# Patient Record
Sex: Female | Born: 1980 | Smoking: Never smoker
Health system: Southern US, Community
[De-identification: ages and names within clinical notes are randomized; demographics above are authoritative.]

## PROBLEM LIST (undated history)

## (undated) DIAGNOSIS — N6459 Other signs and symptoms in breast: Secondary | ICD-10-CM

## (undated) HISTORY — PX: UTERINE FIBROID SURGERY: SHX826

---

## 2006-05-29 ENCOUNTER — Inpatient Hospital Stay: Payer: Self-pay | Admitting: Obstetrics & Gynecology

## 2011-05-09 ENCOUNTER — Ambulatory Visit: Payer: Self-pay | Admitting: Advanced Practice Midwife

## 2011-05-19 ENCOUNTER — Encounter: Payer: Self-pay | Admitting: Obstetrics & Gynecology

## 2011-06-16 ENCOUNTER — Encounter: Payer: Self-pay | Admitting: Maternal & Fetal Medicine

## 2011-08-15 ENCOUNTER — Encounter: Payer: Self-pay | Admitting: Maternal and Fetal Medicine

## 2011-10-20 ENCOUNTER — Encounter: Payer: Self-pay | Admitting: Obstetrics and Gynecology

## 2011-11-23 ENCOUNTER — Inpatient Hospital Stay: Payer: Self-pay

## 2011-11-23 LAB — PIH PROFILE
Anion Gap: 15 (ref 7–16)
BUN: 7 mg/dL (ref 7–18)
Calcium, Total: 8.6 mg/dL (ref 8.5–10.1)
Chloride: 108 mmol/L — ABNORMAL HIGH (ref 98–107)
Co2: 21 mmol/L (ref 21–32)
Creatinine: 0.74 mg/dL (ref 0.60–1.30)
EGFR (African American): >60
EGFR (Non-African Amer.): >60
Glucose: 90 mg/dL (ref 65–99)
HCT: 38 % (ref 35.0–47.0)
HGB: 12.6 g/dL (ref 12.0–16.0)
MCH: 31.1 pg (ref 26.0–34.0)
MCHC: 33.2 g/dL (ref 32.0–36.0)
MCV: 94 fL (ref 80–100)
Osmolality: 284 (ref 275–301)
Platelet: 155 10*3/uL (ref 150–440)
Potassium: 4.3 mmol/L (ref 3.5–5.1)
RBC: 4.06 10*6/uL (ref 3.80–5.20)
RDW: 19.1 % — ABNORMAL HIGH (ref 11.5–14.5)
SGOT(AST): 21 U/L (ref 15–37)
Sodium: 144 mmol/L (ref 136–145)
Uric Acid: 4.3 mg/dL (ref 2.6–6.0)
WBC: 7.1 10*3/uL (ref 3.6–11.0)

## 2011-11-23 LAB — PROTEIN / CREATININE RATIO, URINE
Creatinine, Urine: 72.9 mg/dL (ref 30.0–125.0)
Protein, Random Urine: 31 mg/dL — ABNORMAL HIGH (ref 0–12)
Protein/Creat. Ratio: 425 mg/gCREAT — ABNORMAL HIGH (ref 0–200)

## 2011-11-24 LAB — HEMATOCRIT: HCT: 30.1 % — ABNORMAL LOW (ref 35.0–47.0)

## 2013-03-10 IMAGING — US US OB FOLLOW-UP - NRPT [facility]
1 series · 14 of 28 positions shown · non-contrast
Comparison: none

[Series 1: us ob follow-up - nrpt [facility] · 14 of 36 slices shown]
[im 2/36]
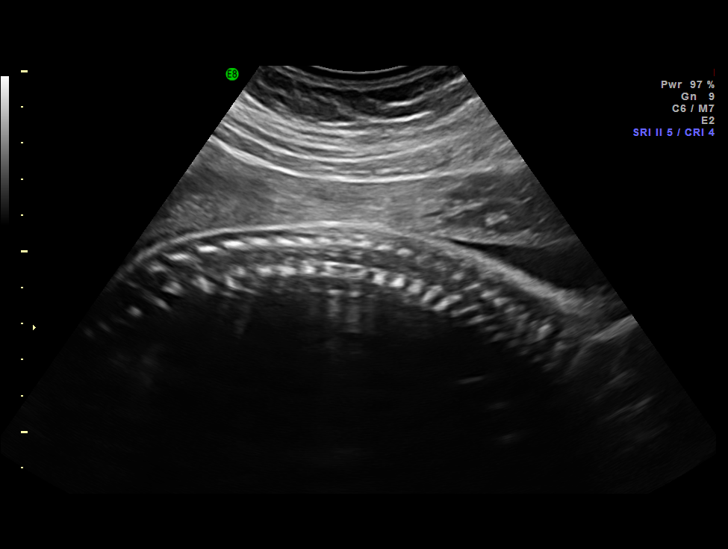
[im 4/36]
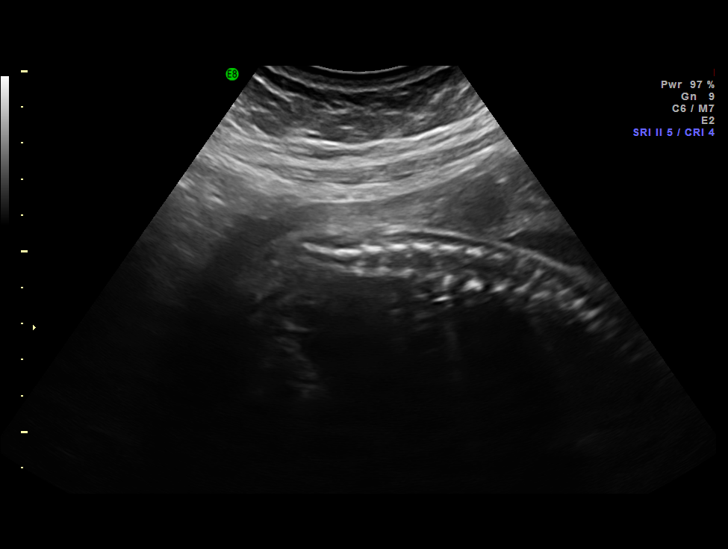
[im 7/36]
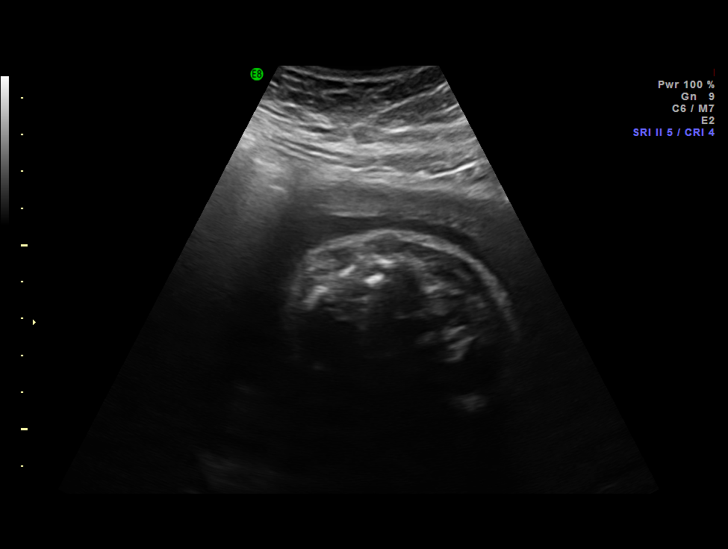
[im 10/36]
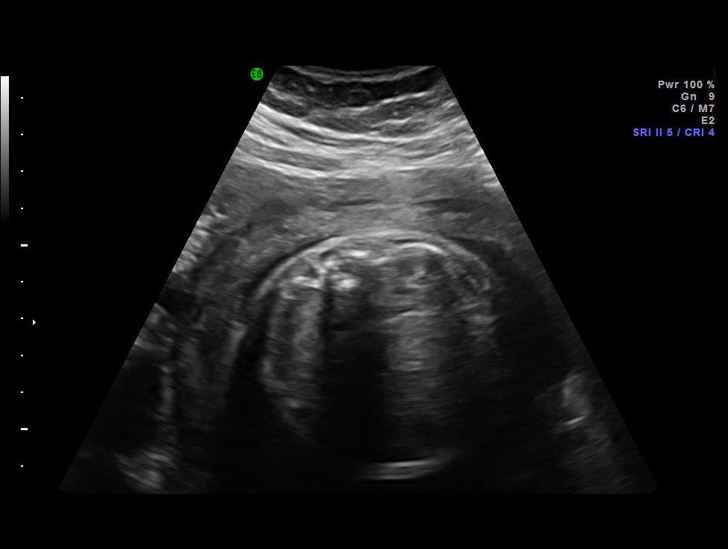
[im 12/36]
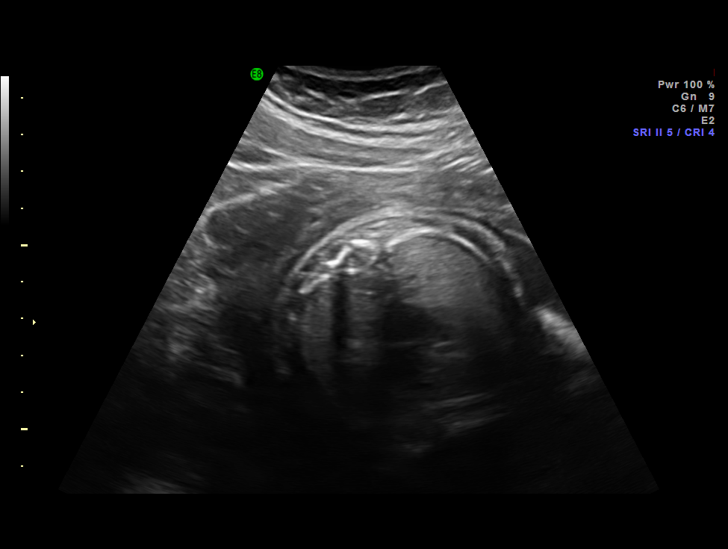
[im 15/36]
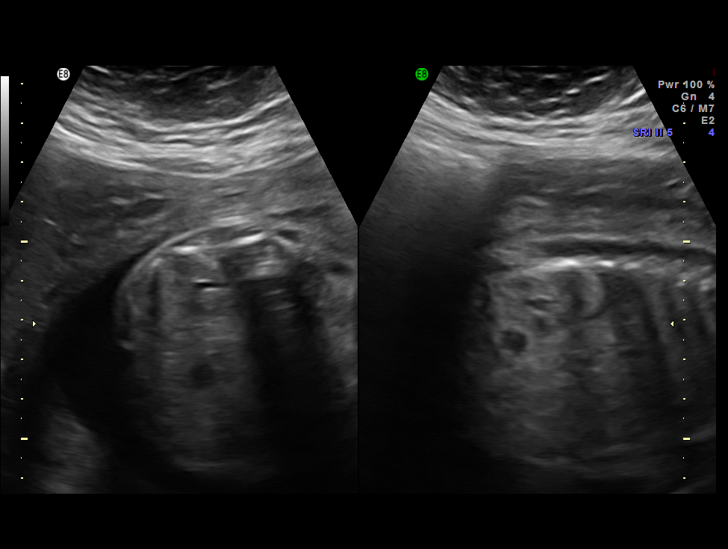
[im 17/36]
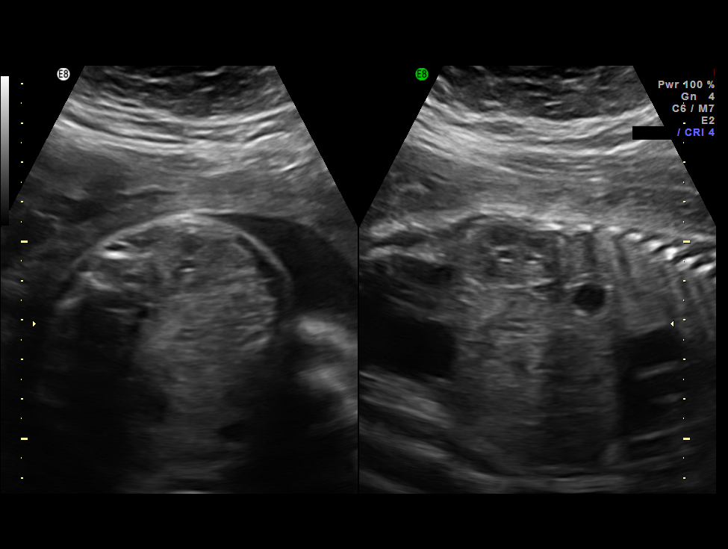
[im 20/36]
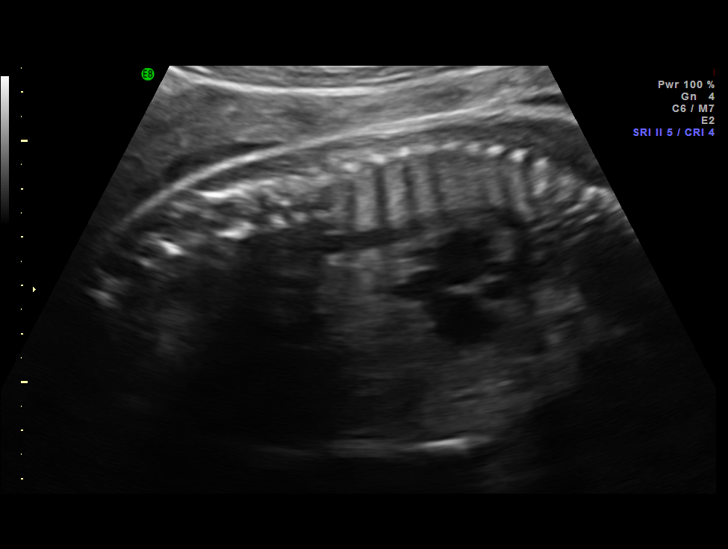
[im 23/36]
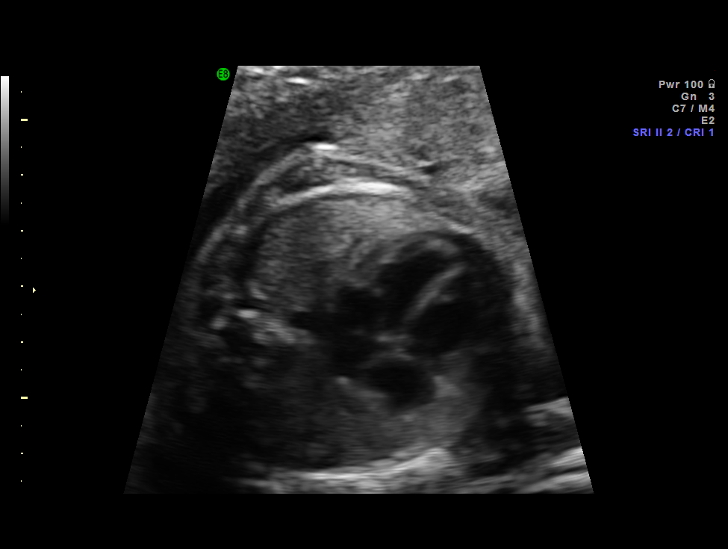
[im 25/36]
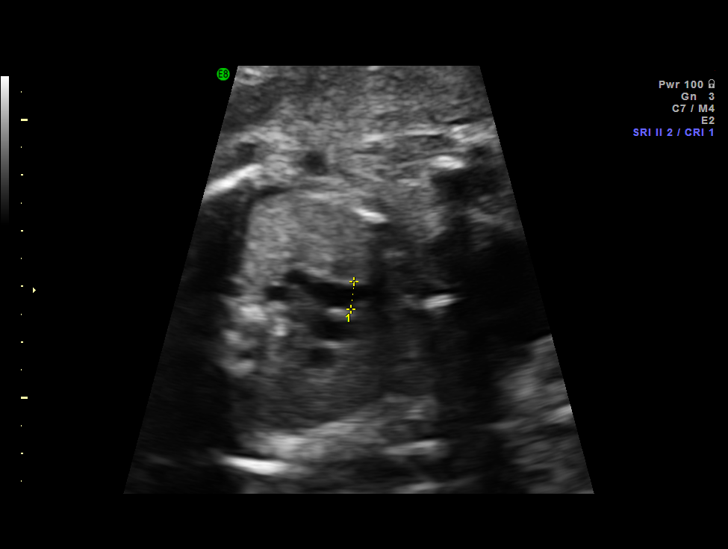
[im 28/36]
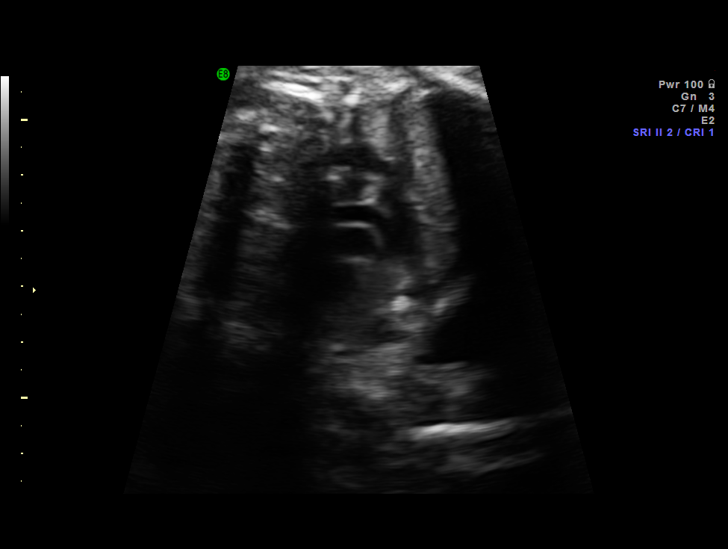
[im 30/36]
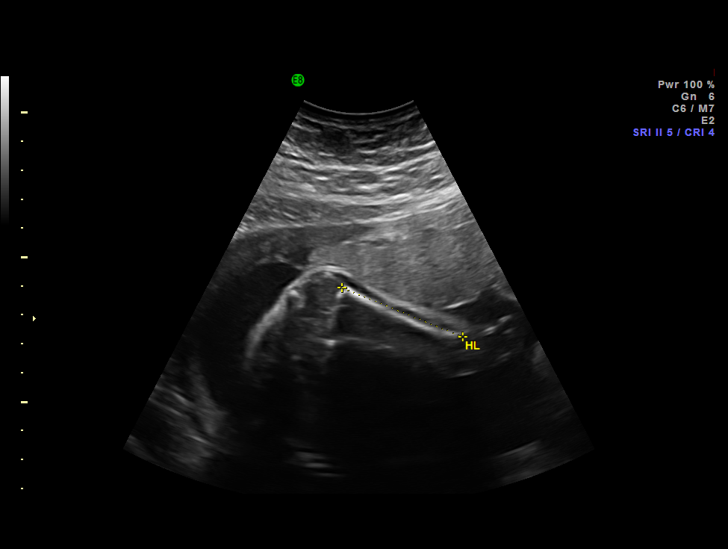
[im 33/36]
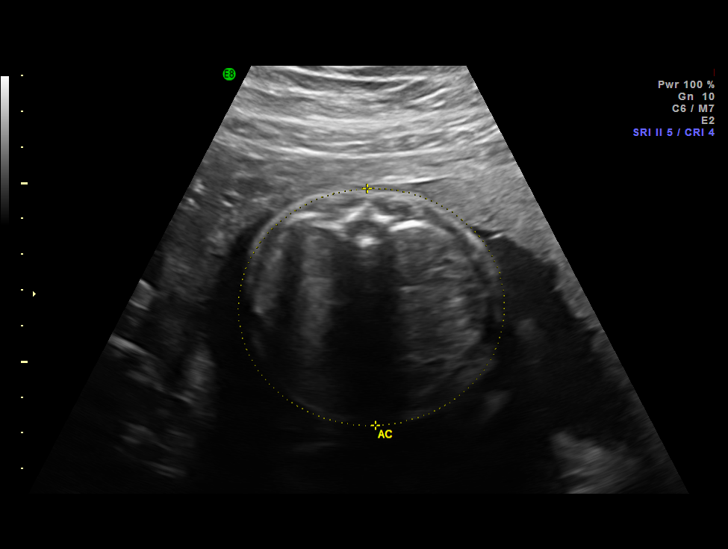
[im 36/36]
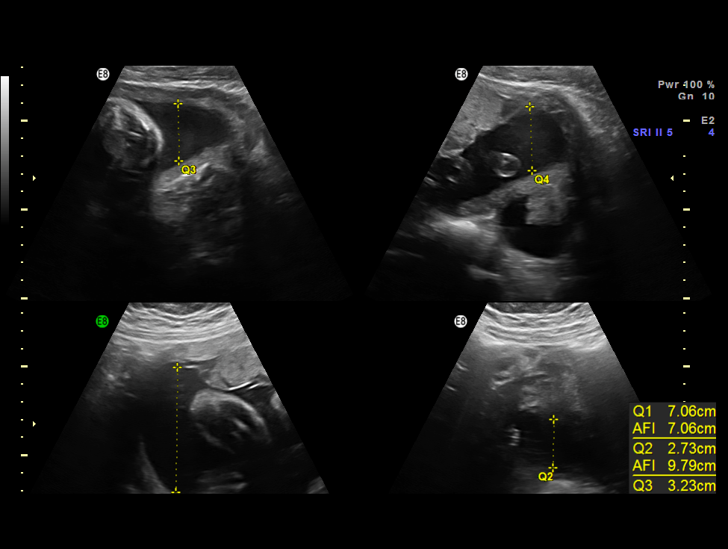

[14 of 28 positions shown; findings below may reference images not displayed]

IMAGES IMPORTED FROM THE [employer] WORKFLOW SYSTEM
NO DICTATION FOR STUDY

## 2014-07-20 ENCOUNTER — Emergency Department: Payer: Self-pay | Admitting: Emergency Medicine

## 2014-07-27 ENCOUNTER — Emergency Department: Payer: Self-pay | Admitting: Emergency Medicine

## 2015-01-27 NOTE — H&P (Signed)
L&D Evaluation:  History:   HPI 34 yo G2P1001 at 2953w4d by D=13wk US derived EDC of 11/19/11 presenting with contractions and SROM.    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  Plan is for removal laproscopically postpartum vs at the time of C-section if one is needed.  PNL O pos, ABSC neg, RI, VZI, RPR NR, HBsAg neg, HIV neg, 1hr OGTT 101, GBS negative.  Patient is s/p TDAP on 09/02/11.    Presents with contractions, leaking fluid    Patient's Medical History No Chronic Illness    Patient's Surgical History Patient with a history of multiple surgeries as an infant    Medications Pre Natal Vitamins    Allergies NKDA    Social History none    Family History Non-Contributory  other   ROS:   ROS Negative unless otherwise   Exam:   Vital Signs BP >140/90    Urine Protein negative dipstick    General Painfully contracting    Abdomen gravid, tender with contractions    Estimated Fetal Weight Average for gestational age    Fetal Position vtx    Pelvic no external lesions, 7cm per nursing    Mebranes Ruptured    Description clear    FHT 120, moderate, +accels, occasional variables    Ucx regular, every 2 minutes   Impression:   Impression active labor   Plan:   Plan EFM/NST, monitor contractions and for cervical change   Electronic Signatures: Lorrene ReidStaebler, Zidan Helget M (MD)  (Signed 06-Mar-13 05:00)  Authored: L&D Evaluation   Last Updated: 06-Mar-13 05:00 by Lorrene ReidStaebler, Philomena Buttermore M (MD)

## 2019-06-17 ENCOUNTER — Ambulatory Visit (INDEPENDENT_AMBULATORY_CARE_PROVIDER_SITE_OTHER): Payer: Self-pay

## 2019-06-17 ENCOUNTER — Other Ambulatory Visit: Payer: Self-pay

## 2019-06-17 ENCOUNTER — Ambulatory Visit
Admission: EM | Admit: 2019-06-17 | Discharge: 2019-06-17 | Disposition: A | Discharge status: Home / Self Care | Payer: Self-pay | Attending: Internal Medicine | Admitting: Internal Medicine

## 2019-06-17 ENCOUNTER — Encounter: Payer: Self-pay | Admitting: Emergency Medicine

## 2019-06-17 DIAGNOSIS — S93401A Sprain of unspecified ligament of right ankle, initial encounter: Secondary | ICD-10-CM

## 2019-06-17 DIAGNOSIS — M25571 Pain in right ankle and joints of right foot: Secondary | ICD-10-CM

## 2019-06-17 DIAGNOSIS — Y999 Unspecified external cause status: Secondary | ICD-10-CM

## 2019-06-17 MED ORDER — IBUPROFEN 600 MG PO TABS: 600.0000 mg | ORAL_TABLET | Freq: Four times a day (QID) | ORAL | 0 refills | Status: AC | PRN

## 2019-06-17 NOTE — ED Provider Notes (Signed)
[facility]    CSN: [id number] Arrival date & time: 06/17/19  2119      History   Chief Complaint Chief Complaint  Patient presents with  . Ankle Pain    right    HPI Debra Hunt is a 38 y.o. female morbid obesity comes to urgent care with complaints of right ankle pain.  Pain started when she woke up this morning.  No preceding trauma.  Pain is sharp and throbbing.  Is of moderate severity.  Associated with swelling of the right ankle.  Aggravated by movement and trying to bear weight.  No known relieving factors.  No numbness or tingling.  No radiation of pain.Marland Kitchen   HPI  History reviewed. No pertinent past medical history.  There are no active problems to display for this patient.   Past Surgical History:  Procedure Laterality Date  . UTERINE FIBROID SURGERY      OB History   No obstetric history on file.      Home Medications    Prior to Admission medications   Medication Sig Start Date End Date Taking? Authorizing Provider  ibuprofen (ADVIL) 600 MG tablet Take 1 tablet (600 mg total) by mouth every 6 (six) hours as needed. 06/17/19   LampteyMyrene Galas, MD    Family History Family History  Problem Relation Age of Onset  . Healthy Mother     Social History Social History   Tobacco Use  . Smoking status: Never Smoker  . Smokeless tobacco: Never Used  Substance Use Topics  . Alcohol use: Not Currently  . Drug use: Not Currently     Allergies   Patient has no known allergies.   Review of Systems Review of Systems  Constitutional: Positive for activity change. Negative for fever.  HENT: Negative.   Eyes: Negative.   Respiratory: Negative.   Genitourinary: Negative.   Musculoskeletal: Positive for arthralgias and joint swelling. Negative for gait problem, myalgias, neck pain and neck stiffness.  Neurological: Negative.      Physical Exam Triage Vital Signs ED Triage Vitals  Enc Vitals Group     BP 06/17/19 0854 132/78   Pulse Rate 06/17/19 0854 88     Resp 06/17/19 0854 18     Temp 06/17/19 0854 98.8 F (37.1 C)     Temp Source 06/17/19 0854 Oral     SpO2 06/17/19 0854 100 %     Weight 06/17/19 0851 261 lb (118.4 kg)     Height 06/17/19 0851 5\' 4"  (1.626 m)     Head Circumference --      Peak Flow --      Pain Score 06/17/19 0851 7     Pain Loc --      Pain Edu? --      Excl. in [place]? --    No data found.  Updated Vital Signs BP 132/78 (BP Location: Left Arm)   Pulse 88   Temp 98.8 F (37.1 C) (Oral)   Resp 18   Ht 5\' 4"  (1.626 m)   Wt 118.4 kg   LMP 05/30/2019 (Approximate)   SpO2 100%   BMI 44.80 kg/m   Visual Acuity Right Eye Distance:   Left Eye Distance:   Bilateral Distance:    Right Eye Near:   Left Eye Near:    Bilateral Near:     Physical Exam Vitals signs and nursing note reviewed.  Constitutional:      General: She is in acute distress.  Appearance: She is not ill-appearing.  Cardiovascular:     Rate and Rhythm: Normal rate and regular rhythm.     Pulses: Normal pulses.     Heart sounds: Normal heart sounds.  Abdominal:     General: Bowel sounds are normal.     Palpations: Abdomen is soft.  Musculoskeletal:        General: Swelling and tenderness present. No deformity or signs of injury.     Comments: Limited range of motion over the right ankle  Skin:    General: Skin is warm.     Capillary Refill: Capillary refill takes less than 2 seconds.     Coloration: Skin is not pale.     Findings: No erythema.  Neurological:     General: No focal deficit present.     Mental Status: She is alert.      UC Treatments / Results  Labs (all labs ordered are listed, but only abnormal results are displayed) Labs Reviewed - No data to display  EKG   Radiology Dg Ankle Complete Right  Result Date: 06/17/2019 CLINICAL DATA:  Lateral ankle pain EXAM: RIGHT ANKLE - COMPLETE 3+ VIEW COMPARISON:  None. FINDINGS: No acute fracture or dislocation. Ankle mortise is  congruent. Minimal degenerative spurring in the inferior aspect of the lateral gutter. Prominent plantar calcaneal spur. Soft tissue swelling most prominently over the medial ankle. IMPRESSION: Soft tissue swelling without acute osseous abnormality. Electronically Signed   By: Duanne Guess M.D.   On: 06/17/2019 09:37    Procedures Procedures (including critical care time)  Medications Ordered in UC Medications - No data to display  Initial Impression / Assessment and Plan / UC Course  I have reviewed the triage vital signs and the nursing notes.  Pertinent labs & imaging results that were available during my care of the patient were reviewed by me and considered in my medical decision making (see chart for details).     1.  Right ankle sprain: Ibuprofen 600 mg every 6 hours as needed for pain Ace wrap of the right ankle Rest, ice, compression and elevation Gentle range of motion every 24-48 hours X-ray of the right ankle is negative for acute fracture Patient is advised to return to urgent care if the symptoms persist or worsens. Final Clinical Impressions(s) / UC Diagnoses   Final diagnoses:  Sprain of right ankle, unspecified ligament, initial encounter   Discharge Instructions   None    ED Prescriptions    Medication Sig Dispense Auth. Provider   ibuprofen (ADVIL) 600 MG tablet Take 1 tablet (600 mg total) by mouth every 6 (six) hours as needed. 40 tablet , Britta Mccreedy, MD     PDMP not reviewed this encounter.   Merrilee Jansky, MD 06/19/19 1224

## 2019-06-17 NOTE — ED Triage Notes (Signed)
Pt c/o right ankle pain. She states that it has been sore for a couple days but when she woke up last night to use the restroom she could not out any weight on it. Mild swelling. No bruising. She does not remember injuring it in any way.

## 2021-08-23 ENCOUNTER — Encounter: Payer: Self-pay | Admitting: Emergency Medicine

## 2021-08-23 ENCOUNTER — Other Ambulatory Visit: Payer: Self-pay

## 2021-08-23 ENCOUNTER — Ambulatory Visit
Admission: EM | Admit: 2021-08-23 | Discharge: 2021-08-23 | Disposition: A | Discharge status: Home / Self Care | Payer: BC Managed Care – PPO | Attending: Physician Assistant | Admitting: Physician Assistant

## 2021-08-23 DIAGNOSIS — M545 Low back pain, unspecified: Secondary | ICD-10-CM | POA: Diagnosis not present

## 2021-08-23 MED ORDER — PREDNISONE 10 MG PO TABS: ORAL_TABLET | ORAL | 0 refills | Status: AC

## 2021-08-23 NOTE — Discharge Instructions (Addendum)

## 2021-08-23 NOTE — ED Provider Notes (Signed)
[facility]    CSN: [id number] Arrival date & time: 08/23/21  0816      History   Chief Complaint Chief Complaint  Patient presents with   Back Pain    HPI Debra Hunt is a 40 y.o. female presenting for onset of midline lower back pain last night.  Pain does not radiate across her lower back or down her lower extremities.  No associated numbness, weakness or tingling.  No bowel or bladder incontinence or falls.  Patient denies any injuries.  Does not report doing anything out of the ordinary yesterday before onset of pain.  She has taken ibuprofen for pain.  She says pain is manageable if she does not move.  Increased pain when she changes positions.  Increased pain when lying flat and bending forward.  Patient says she slept on her side last night because she could not sleep in any other position.  She denies any similar problems in the past.  Does not report any fevers, urinary symptoms.  No other complaints.  HPI  History reviewed. No pertinent past medical history.  There are no problems to display for this patient.   Past Surgical History:  Procedure Laterality Date   UTERINE FIBROID SURGERY      OB History   No obstetric history on file.      Home Medications    Prior to Admission medications   Medication Sig Start Date End Date Taking? Authorizing Provider  ibuprofen (ADVIL) 600 MG tablet Take 1 tablet (600 mg total) by mouth every 6 (six) hours as needed. 06/17/19  Yes Lamptey, Myrene Galas, MD  predniSONE (DELTASONE) 10 MG tablet Take 6 tabs p.o. on day 1 and decrease by 1 tablet daily until complete 08/23/21  Yes Danton Clap, PA-C    Family History Family History  Problem Relation Age of Onset   Healthy Mother     Social History Social History   Tobacco Use   Smoking status: Never   Smokeless tobacco: Never  Vaping Use   Vaping Use: Never used  Substance Use Topics   Alcohol use: Not Currently   Drug use: Not Currently      Allergies   Patient has no known allergies.   Review of Systems Review of Systems  Constitutional:  Negative for fatigue and fever.  Genitourinary:  Negative for dysuria, flank pain and hematuria.  Musculoskeletal:  Positive for back pain. Negative for gait problem.  Neurological:  Negative for weakness and numbness.    Physical Exam Triage Vital Signs ED Triage Vitals  Enc Vitals Group     BP      Pulse      Resp      Temp      Temp src      SpO2      Weight      Height      Head Circumference      Peak Flow      Pain Score      Pain Loc      Pain Edu?      Excl. in [place]?    No data found.  Updated Vital Signs BP (!) 152/98 (BP Location: Left Arm)   Pulse 90   Temp 98.6 F (37 C) (Oral)   Resp 18   Ht 5\' 4"  (1.626 m)   Wt 261 lb 0.4 oz (118.4 kg)   LMP 08/17/2021 (Approximate)   SpO2 99%   BMI 44.80 kg/m  Physical Exam Vitals and nursing note reviewed.  Constitutional:      General: She is not in acute distress.    Appearance: Normal appearance. She is not ill-appearing or toxic-appearing.  HENT:     Head: Normocephalic and atraumatic.  Eyes:     General: No scleral icterus.       Right eye: No discharge.        Left eye: No discharge.     Conjunctiva/sclera: Conjunctivae normal.  Cardiovascular:     Rate and Rhythm: Normal rate and regular rhythm.     Heart sounds: Normal heart sounds.  Pulmonary:     Effort: Pulmonary effort is normal. No respiratory distress.     Breath sounds: Normal breath sounds.  Musculoskeletal:     Cervical back: Neck supple.     Lumbar back: No tenderness or bony tenderness. Decreased range of motion. Negative right straight leg raise test and negative left straight leg raise test.     Comments: Increased pain in lower back when lifting both legs but no radiation of pain to lower extremities.  No tenderness of any part of back but patient points toward the L4-L5 and L5-S1 region to indicate that is where she is  having pain along the midline.  Skin:    General: Skin is dry.  Neurological:     General: No focal deficit present.     Mental Status: She is alert. Mental status is at baseline.     Motor: No weakness.     Gait: Gait normal.  Psychiatric:        Mood and Affect: Mood normal.        Behavior: Behavior normal.        Thought Content: Thought content normal.     UC Treatments / Results  Labs (all labs ordered are listed, but only abnormal results are displayed) Labs Reviewed - No data to display  EKG   Radiology No results found.  Procedures Procedures (including critical care time)  Medications Ordered in UC Medications - No data to display  Initial Impression / Assessment and Plan / UC Course  I have reviewed the triage vital signs and the nursing notes.  Pertinent labs & imaging results that were available during my care of the patient were reviewed by me and considered in my medical decision making (see chart for details).  39 year old female presenting for onset of atraumatic midline lower back pain last night.  Pain does not radiate and is not associated with any red flag signs or symptoms.  Suspect patient may have bulging disc, lumbar strain, spinal stenosis or other spinal lower back pain.  We will treat at this time with prednisone Dosepak.  Also reviewed supportive care at home with lidocaine patches, muscle rubs, heating pad.  Advised to try to stay active and not be on bedrest.  I did give her a work note.  Advised to follow-up with PCP if not improving over the next week.  Reviewed ED red flag signs and symptoms with patient.   Final Clinical Impressions(s) / UC Diagnoses   Final diagnoses:  Acute midline low back pain without sciatica     Discharge Instructions      BACK PAIN: Stressed avoiding painful activities . RICE (REST, ICE, COMPRESSION, ELEVATION) guidelines reviewed. May alternate ice and heat. Consider use of muscle rubs, Salonpas patches,  etc. Use medications as directed including muscle relaxers if prescribed. Take anti-inflammatory medications as prescribed or OTC NSAIDs/Tylenol.  F/u with  PCP in 7-10 days for reexamination, and please feel free to call or return to the urgent care at any time for any questions or concerns you may have and we will be happy to help you!   BACK PAIN RED FLAGS: If the back pain acutely worsens or there are any red flag symptoms such as numbness/tingling, leg weakness, saddle anesthesia, or loss of bowel/bladder control, go immediately to the ER. Follow up with [place] as scheduled or sooner if the pain does not begin to resolve or if it worsens before the follow up       ED Prescriptions     Medication Sig Dispense Auth. Provider   predniSONE (DELTASONE) 10 MG tablet Take 6 tabs p.o. on day 1 and decrease by 1 tablet daily until complete 21 tablet Shirlee Latch, PA-C      PDMP not reviewed this encounter.   Shirlee Latch, PA-C 08/23/21 [phone]

## 2021-08-23 NOTE — ED Triage Notes (Signed)
Pt c/o lower back pain. Started last night. Denies any known injury. She states when she sits or moves a certain way the pain is worse. Denies any radiating pain. Denies urinary symptoms.

## 2022-08-05 ENCOUNTER — Other Ambulatory Visit: Payer: Self-pay | Admitting: *Deleted

## 2022-08-05 DIAGNOSIS — Z1231 Encounter for screening mammogram for malignant neoplasm of breast: Secondary | ICD-10-CM

## 2022-08-08 ENCOUNTER — Other Ambulatory Visit: Payer: Self-pay

## 2022-08-08 DIAGNOSIS — Z1231 Encounter for screening mammogram for malignant neoplasm of breast: Secondary | ICD-10-CM

## 2022-08-09 ENCOUNTER — Other Ambulatory Visit: Payer: Self-pay | Admitting: Student

## 2022-08-09 ENCOUNTER — Ambulatory Visit
Admission: RE | Admit: 2022-08-09 | Discharge: 2022-08-09 | Disposition: A | Discharge status: Home / Self Care | Payer: BC Managed Care – PPO | Source: Ambulatory Visit | Attending: *Deleted | Admitting: *Deleted

## 2022-08-09 DIAGNOSIS — Z1231 Encounter for screening mammogram for malignant neoplasm of breast: Secondary | ICD-10-CM

## 2022-08-09 HISTORY — DX: Other signs and symptoms in breast: N64.59
